# Patient Record
Sex: Male | Born: 1970 | Race: White | Hispanic: No | Marital: Married | Smoking: Current every day smoker
Health system: Southern US, Community
[De-identification: ages and names within clinical notes are randomized; demographics above are authoritative.]

## PROBLEM LIST (undated history)

## (undated) DIAGNOSIS — I219 Acute myocardial infarction, unspecified: Secondary | ICD-10-CM

## (undated) HISTORY — PX: CORONARY ANGIOPLASTY WITH STENT PLACEMENT: SHX49

## (undated) HISTORY — PX: TOTAL KNEE ARTHROPLASTY: SHX125

---

## 2016-11-08 ENCOUNTER — Emergency Department
Admission: EM | Admit: 2016-11-08 | Discharge: 2016-11-08 | Disposition: A | Discharge status: Home / Self Care | Payer: Non-veteran care | Attending: Emergency Medicine | Admitting: Emergency Medicine

## 2016-11-08 ENCOUNTER — Emergency Department: Payer: Non-veteran care

## 2016-11-08 DIAGNOSIS — Y92481 Parking lot as the place of occurrence of the external cause: Secondary | ICD-10-CM | POA: Diagnosis not present

## 2016-11-08 DIAGNOSIS — G8929 Other chronic pain: Secondary | ICD-10-CM | POA: Diagnosis not present

## 2016-11-08 DIAGNOSIS — F17200 Nicotine dependence, unspecified, uncomplicated: Secondary | ICD-10-CM | POA: Diagnosis not present

## 2016-11-08 DIAGNOSIS — F10120 Alcohol abuse with intoxication, uncomplicated: Secondary | ICD-10-CM | POA: Insufficient documentation

## 2016-11-08 DIAGNOSIS — S0511XA Contusion of eyeball and orbital tissues, right eye, initial encounter: Secondary | ICD-10-CM | POA: Diagnosis not present

## 2016-11-08 DIAGNOSIS — I252 Old myocardial infarction: Secondary | ICD-10-CM | POA: Insufficient documentation

## 2016-11-08 DIAGNOSIS — M545 Low back pain, unspecified: Secondary | ICD-10-CM

## 2016-11-08 DIAGNOSIS — Y999 Unspecified external cause status: Secondary | ICD-10-CM | POA: Diagnosis not present

## 2016-11-08 DIAGNOSIS — Y9389 Activity, other specified: Secondary | ICD-10-CM | POA: Insufficient documentation

## 2016-11-08 DIAGNOSIS — F10920 Alcohol use, unspecified with intoxication, uncomplicated: Secondary | ICD-10-CM

## 2016-11-08 DIAGNOSIS — S0990XA Unspecified injury of head, initial encounter: Secondary | ICD-10-CM | POA: Diagnosis present

## 2016-11-08 HISTORY — DX: Acute myocardial infarction, unspecified: I21.9

## 2016-11-08 MED ORDER — KETOROLAC TROMETHAMINE 60 MG/2ML IM SOLN
30.0000 mg | Freq: Once | INTRAMUSCULAR | Status: AC
  Administered 2016-11-08: 30 mg via INTRAMUSCULAR
  Filled 2016-11-08: qty 2

## 2016-11-08 MED ORDER — NAPROXEN 500 MG PO TBEC: 500.0000 mg | DELAYED_RELEASE_TABLET | Freq: Two times a day (BID) | ORAL | 0 refills | Status: AC

## 2016-11-08 MED ORDER — ORPHENADRINE CITRATE 30 MG/ML IJ SOLN
60.0000 mg | INTRAMUSCULAR | Status: AC
  Administered 2016-11-08: 60 mg via INTRAMUSCULAR
  Filled 2016-11-08: qty 2
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
## 2017-09-06 ENCOUNTER — Other Ambulatory Visit: Payer: Self-pay

## 2017-09-06 ENCOUNTER — Emergency Department: Payer: Non-veteran care

## 2017-09-06 ENCOUNTER — Emergency Department
Admission: EM | Admit: 2017-09-06 | Discharge: 2017-09-07 | Disposition: A | Discharge status: Other Acute Inpatient Hospital | Payer: Non-veteran care | Attending: Emergency Medicine | Admitting: Emergency Medicine

## 2017-09-06 ENCOUNTER — Encounter: Payer: Self-pay | Admitting: Emergency Medicine

## 2017-09-06 DIAGNOSIS — H1132 Conjunctival hemorrhage, left eye: Secondary | ICD-10-CM | POA: Insufficient documentation

## 2017-09-06 DIAGNOSIS — Y9301 Activity, walking, marching and hiking: Secondary | ICD-10-CM | POA: Insufficient documentation

## 2017-09-06 DIAGNOSIS — S0990XA Unspecified injury of head, initial encounter: Secondary | ICD-10-CM | POA: Diagnosis present

## 2017-09-06 DIAGNOSIS — T07XXXA Unspecified multiple injuries, initial encounter: Secondary | ICD-10-CM | POA: Insufficient documentation

## 2017-09-06 DIAGNOSIS — R4701 Aphasia: Secondary | ICD-10-CM | POA: Diagnosis not present

## 2017-09-06 DIAGNOSIS — S0231XA Fracture of orbital floor, right side, initial encounter for closed fracture: Secondary | ICD-10-CM | POA: Insufficient documentation

## 2017-09-06 DIAGNOSIS — S0181XA Laceration without foreign body of other part of head, initial encounter: Secondary | ICD-10-CM | POA: Insufficient documentation

## 2017-09-06 DIAGNOSIS — Y92410 Unspecified street and highway as the place of occurrence of the external cause: Secondary | ICD-10-CM | POA: Diagnosis not present

## 2017-09-06 DIAGNOSIS — S0001XA Abrasion of scalp, initial encounter: Secondary | ICD-10-CM | POA: Insufficient documentation

## 2017-09-06 DIAGNOSIS — S022XXA Fracture of nasal bones, initial encounter for closed fracture: Secondary | ICD-10-CM | POA: Diagnosis not present

## 2017-09-06 DIAGNOSIS — S060X0A Concussion without loss of consciousness, initial encounter: Secondary | ICD-10-CM | POA: Diagnosis not present

## 2017-09-06 DIAGNOSIS — S02401A Maxillary fracture, unspecified, initial encounter for closed fracture: Secondary | ICD-10-CM | POA: Insufficient documentation

## 2017-09-06 DIAGNOSIS — Y998 Other external cause status: Secondary | ICD-10-CM | POA: Diagnosis not present

## 2017-09-06 LAB — ETHANOL: ALCOHOL ETHYL (B): 273 mg/dL — AB (ref <10)

## 2017-09-06 MED ORDER — TETANUS-DIPHTH-ACELL PERTUSSIS 5-2.5-18.5 LF-MCG/0.5 IM SUSP
0.5000 mL | Freq: Once | INTRAMUSCULAR | Status: AC
  Administered 2017-09-07: 0.5 mL via INTRAMUSCULAR
  Filled 2017-09-06: qty 0.5
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
## 2017-09-07 ENCOUNTER — Emergency Department: Payer: Non-veteran care

## 2017-09-07 LAB — COMPREHENSIVE METABOLIC PANEL
ALBUMIN: 4.6 g/dL (ref 3.5–5.0)
ALT: 26 U/L (ref 17–63)
ANION GAP: 13 (ref 5–15)
AST: 39 U/L (ref 15–41)
Alkaline Phosphatase: 63 U/L (ref 38–126)
BUN: 12 mg/dL (ref 6–20)
CHLORIDE: 106 mmol/L (ref 101–111)
CO2: 22 mmol/L (ref 22–32)
Calcium: 9 mg/dL (ref 8.9–10.3)
Creatinine, Ser: 1.23 mg/dL (ref 0.61–1.24)
GFR calc Af Amer: >60 mL/min (ref >60)
GFR calc non Af Amer: >60 mL/min (ref >60)
GLUCOSE: 82 mg/dL (ref 65–99)
Potassium: 4.2 mmol/L (ref 3.5–5.1)
SODIUM: 141 mmol/L (ref 135–145)
TOTAL PROTEIN: 8 g/dL (ref 6.5–8.1)
Total Bilirubin: 0.7 mg/dL (ref 0.3–1.2)

## 2017-09-07 LAB — PROTIME-INR
INR: 1.06
PROTHROMBIN TIME: 13.7 s (ref 11.4–15.2)

## 2017-09-07 LAB — TROPONIN I: Troponin I: <0.03 ng/mL (ref <0.03)

## 2017-09-07 LAB — CBC WITH DIFFERENTIAL/PLATELET
BASOS ABS: 0 10*3/uL (ref 0–0.1)
BASOS PCT: 1 %
EOS ABS: 0.1 10*3/uL (ref 0–0.7)
Eosinophils Relative: 1 %
HCT: 42.8 % (ref 40.0–52.0)
Hemoglobin: 14.1 g/dL (ref 13.0–18.0)
Lymphocytes Relative: 36 %
Lymphs Abs: 3.1 10*3/uL (ref 1.0–3.6)
MCH: 29.4 pg (ref 26.0–34.0)
MCHC: 32.9 g/dL (ref 32.0–36.0)
MCV: 89.4 fL (ref 80.0–100.0)
MONO ABS: 0.4 10*3/uL (ref 0.2–1.0)
MONOS PCT: 5 %
Neutro Abs: 5 10*3/uL (ref 1.4–6.5)
Neutrophils Relative %: 57 %
Platelets: 282 10*3/uL (ref 150–440)
RBC: 4.78 MIL/uL (ref 4.40–5.90)
RDW: 15.2 % — AB (ref 11.5–14.5)
WBC: 8.7 10*3/uL (ref 3.8–10.6)

## 2017-09-07 LAB — APTT: aPTT: 28 seconds (ref 24–36)

## 2017-09-07 LAB — LIPASE, BLOOD: Lipase: 27 U/L (ref 11–51)

## 2017-09-07 MED ORDER — AMOXICILLIN 500 MG PO CAPS
500.0000 mg | ORAL_CAPSULE | Freq: Once | ORAL | Status: AC
  Administered 2017-09-07: 500 mg via ORAL
  Filled 2017-09-07: qty 1

## 2017-09-07 MED ORDER — LIDOCAINE HCL (PF) 1 % IJ SOLN
INTRAMUSCULAR | Status: AC
  Filled 2017-09-07: qty 5
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
## 2017-09-07 NOTE — ED Notes (Signed)
Warm blanket given. Pt remains in NAD, VSS. Resting comfortably on stretcher. Will continue to monitor.

## 2017-09-07 NOTE — ED Notes (Signed)
[place] completed, consent for transfer signed by pt and VS within 30 mins of transfer.

## 2017-09-07 NOTE — ED Notes (Signed)
ED Provider at bedside. 

## 2017-09-07 NOTE — ED Notes (Signed)
Family at bedside. 

## 2017-09-07 NOTE — ED Notes (Addendum)
Neuro reassessment by EDP, pt decreased orientation at this time.

## 2017-09-07 NOTE — ED Notes (Signed)
Patient transported to CT 

## 2017-09-07 NOTE — ED Notes (Addendum)
Remer MachoGriffin Thompson, stepson. [phone]  Cal when d/c.

## 2017-09-09 MED ORDER — HYDROMORPHONE HCL 1 MG/ML IJ SOLN
0.50 mg | INTRAMUSCULAR | Status: DC
Start: ? — End: 2017-09-09
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
